# Patient Record
Sex: Male | Born: 2000 | Race: White | Hispanic: No | Marital: Single | Smoking: Never smoker
Health system: Southern US, Community
[De-identification: ages and names within clinical notes are randomized; demographics above are authoritative.]

## PROBLEM LIST (undated history)

## (undated) DIAGNOSIS — J302 Other seasonal allergic rhinitis: Secondary | ICD-10-CM

## (undated) HISTORY — PX: OTHER SURGICAL HISTORY: SHX169

---
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
## 2022-11-12 ENCOUNTER — Ambulatory Visit (HOSPITAL_BASED_OUTPATIENT_CLINIC_OR_DEPARTMENT_OTHER): Payer: 59 | Admitting: Family Medicine
